# Patient Record
Sex: Female | Born: 2000 | Race: White | Hispanic: No | Marital: Single | Smoking: Never smoker
Health system: Southern US, Community
[De-identification: ages and names within clinical notes are randomized; demographics above are authoritative.]

## PROBLEM LIST (undated history)

## (undated) DIAGNOSIS — J309 Allergic rhinitis, unspecified: Secondary | ICD-10-CM

## (undated) DIAGNOSIS — L309 Dermatitis, unspecified: Secondary | ICD-10-CM

## (undated) DIAGNOSIS — N181 Chronic kidney disease, stage 1: Secondary | ICD-10-CM

## (undated) DIAGNOSIS — J4520 Mild intermittent asthma, uncomplicated: Secondary | ICD-10-CM

## (undated) DIAGNOSIS — Z789 Other specified health status: Secondary | ICD-10-CM

## (undated) HISTORY — PX: NO PAST SURGERIES: SHX2092

---
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
## 2022-05-18 ENCOUNTER — Other Ambulatory Visit: Payer: Self-pay

## 2022-05-18 ENCOUNTER — Encounter: Payer: Self-pay | Admitting: Physician Assistant

## 2022-05-18 DIAGNOSIS — Z3009 Encounter for other general counseling and advice on contraception: Secondary | ICD-10-CM

## 2022-05-18 MED ORDER — VIENVA 0.1-20 MG-MCG PO TABS
1.0000 | ORAL_TABLET | Freq: Every day | ORAL | 3 refills | Status: DC
  Filled 2022-05-18: qty 84, 84d supply, fill #0

## 2022-05-18 NOTE — Telephone Encounter (Signed)
LOV 09/28/21 NOV none LRF 09/28/21 84 x 3

## 2022-05-21 ENCOUNTER — Other Ambulatory Visit: Payer: Self-pay

## 2022-05-22 ENCOUNTER — Other Ambulatory Visit: Payer: Self-pay

## 2022-05-23 ENCOUNTER — Other Ambulatory Visit: Payer: Self-pay

## 2022-10-03 ENCOUNTER — Encounter: Payer: Self-pay | Admitting: Family Medicine

## 2022-10-03 ENCOUNTER — Ambulatory Visit (INDEPENDENT_AMBULATORY_CARE_PROVIDER_SITE_OTHER): Payer: 59 | Admitting: Family Medicine

## 2022-10-03 VITALS — BP 108/75 | HR 85 | Temp 98.4°F | Ht 68.0 in | Wt 154.0 lb

## 2022-10-03 DIAGNOSIS — Z0000 Encounter for general adult medical examination without abnormal findings: Secondary | ICD-10-CM | POA: Diagnosis not present

## 2022-10-03 DIAGNOSIS — J4520 Mild intermittent asthma, uncomplicated: Secondary | ICD-10-CM | POA: Diagnosis not present

## 2022-10-03 DIAGNOSIS — L309 Dermatitis, unspecified: Secondary | ICD-10-CM | POA: Diagnosis not present

## 2022-10-03 DIAGNOSIS — Z3009 Encounter for other general counseling and advice on contraception: Secondary | ICD-10-CM

## 2022-10-03 MED ORDER — DESONIDE 0.05 % EX CREA: TOPICAL_CREAM | Freq: Two times a day (BID) | CUTANEOUS | 1 refills | Status: DC

## 2022-10-03 NOTE — Progress Notes (Signed)
Complete physical exam   Patient: Janet Ross   DOB: 12-19-2000   22 y.o. Female  MRN: [id number] Visit Date: 10/03/2022  Today's healthcare provider: Sherlyn Hay, DO   Chief Complaint  Patient presents with   Annual Exam   Subjective    Janet Ross is a 22 y.o. female who presents today for a complete physical exam.  She reports consuming a general diet. Home exercise routine includes walk/run occasionally oc local trails. She generally feels well. She reports sleeping well. She does not have additional problems to discuss today.  HPI  Flu vaccine: Declined HIV and Hep C screening: Okay to draw per patient  Not currently sexually active for past 1-1.5 years.   Asthma: No flare-up since 2021-2022  - has inhaler and nebulizer at home  Eczema: She states that her eczema is well-controlled at this point. She takes her Xyzal as needed   Past Medical History:  Diagnosis Date   History of MRSA infection 05/07/2012   No past surgical history on file. Social History   Socioeconomic History   Marital status: Single    Spouse name: single   Number of children: 0   Years of education: 6   Highest education level: Not on file  Occupational History   Occupation: [employer]    Comment: going to 7th grade  Tobacco Use   Smoking status: Never   Smokeless tobacco: Never  Vaping Use   Vaping status: Never Used  Substance and Sexual Activity   Alcohol use: Not Currently   Drug use: No   Sexual activity: Yes    Birth control/protection: Condom, Pill, None  Other Topics Concern   Not on file  Social History Narrative   Not on file   Social Determinants of Health   Financial Resource Strain: Not on file  Food Insecurity: Not on file  Transportation Needs: Not on file  Physical Activity: Not on file  Stress: Not on file  Social Connections: Not on file  Intimate Partner Violence: Not on file   Family Status  Relation Name Status   Mother   Alive   Father [place] Alive   Brother [place] Alive   Brother  Alive   MGF Konrad Dolores Bish (Not Specified)   Neg Hx  (Not Specified)  No partnership data on file   Family History  Problem Relation Age of Onset   Healthy Mother    Hypertension Father    Diabetes Father    Hyperlipidemia Father    ADD / ADHD Brother    Hypertension Maternal Grandfather    Colon cancer Neg Hx    Breast cancer Neg Hx    Ovarian cancer Neg Hx    Allergies  Allergen Reactions   Sulfa Antibiotics Other (See Comments)    Other reaction(s): Headache    Patient Care Team: ,  N, DO as PCP - General (Family Medicine)   Medications: Outpatient Medications Prior to Visit  Medication Sig   albuterol (VENTOLIN HFA) 108 (90 Base) MCG/ACT inhaler Inhale 2 puffs into the lungs every 6 (six) hours as needed for wheezing or shortness of breath.   diphenhydrAMINE (BENADRYL) 25 MG tablet Take 25 mg by mouth every 6 (six) hours as needed.   hydrocortisone cream 0.5 % Apply 1 application topically 2 (two) times daily.   levalbuterol (XOPENEX) 0.63 MG/3ML nebulizer solution Take 3 mLs (0.63 mg total) by nebulization every 4 (four) hours as needed for  wheezing or shortness of breath.   levocetirizine (XYZAL) 5 MG tablet Take 1 tablet (5 mg total) by mouth every evening.   VIENVA 0.1-20 MG-MCG tablet Take 1 tablet by mouth daily.   No facility-administered medications prior to visit.    Review of Systems  Constitutional:  Negative for chills, fatigue and fever.  HENT:  Negative for congestion, ear pain, rhinorrhea, sneezing and sore throat.   Eyes: Negative.  Negative for pain and redness.  Respiratory:  Negative for cough, shortness of breath and wheezing.   Cardiovascular:  Negative for chest pain and leg swelling.  Gastrointestinal:  Negative for abdominal pain, blood in stool, constipation, diarrhea and nausea.  Endocrine: Negative for polydipsia and polyphagia.  Genitourinary: Negative.   Negative for dysuria, flank pain, hematuria, pelvic pain, vaginal bleeding and vaginal discharge.  Musculoskeletal:  Negative for arthralgias, back pain, gait problem and joint swelling.  Skin:  Negative for rash.  Neurological: Negative.  Negative for dizziness, tremors, seizures, weakness, light-headedness, numbness and headaches.  Hematological:  Negative for adenopathy.  Psychiatric/Behavioral: Negative.  Negative for behavioral problems, confusion and dysphoric mood. The patient is not nervous/anxious and is not hyperactive.       Objective    BP 108/75 (BP Location: Right Arm, Patient Position: Sitting, Cuff Size: Normal)   Pulse 85   Temp 98.4 F (36.9 C) (Oral)   Ht 5\' 8"  (1.727 m)   Wt 154 lb (69.9 kg)   SpO2 100%   BMI 23.42 kg/m    Physical Exam Vitals reviewed.  Constitutional:      General: She is not in acute distress.    Appearance: Normal appearance. She is well-developed. She is not diaphoretic.  HENT:     Head: Normocephalic and atraumatic.      Comments: Dry scaly patch in region marked and red. Excoriation with scabbing noted to region marked in blue (underneath edge of hairline)    Right Ear: External ear normal. There is no impacted cerumen (cerumen oscuring view of TM; not impacted).     Left Ear: External ear normal. There is no impacted cerumen (cerumen oscuring view of TM; not impacted).     Nose: Nose normal.     Mouth/Throat:     Mouth: Mucous membranes are moist.     Pharynx: Oropharynx is clear. No oropharyngeal exudate.  Eyes:     General: No scleral icterus.    Conjunctiva/sclera: Conjunctivae normal.     Pupils: Pupils are equal, round, and reactive to light.  Neck:     Thyroid: No thyromegaly.  Cardiovascular:     Rate and Rhythm: Normal rate and regular rhythm.     Pulses: Normal pulses.     Heart sounds: Normal heart sounds. No murmur heard. Pulmonary:     Effort: Pulmonary effort is normal. No respiratory distress.     Breath  sounds: Normal breath sounds. No wheezing or rales.  Abdominal:     General: There is no distension.     Palpations: Abdomen is soft. There is no mass.     Tenderness: There is no abdominal tenderness. There is no guarding.  Musculoskeletal:        General: No deformity.     Cervical back: Neck supple.     Right lower leg: No edema.     Left lower leg: No edema.  Lymphadenopathy:     Cervical: No cervical adenopathy.  Skin:    General: Skin is warm and dry.  Findings: Rash present.  Neurological:     Mental Status: She is alert and oriented to person, place, and time. Mental status is at baseline.     Gait: Gait normal.  Psychiatric:        Mood and Affect: Mood normal.        Behavior: Behavior normal.        Thought Content: Thought content normal.      Last depression screening scores    10/03/2022    2:51 PM 09/28/2021    2:21 PM 02/11/2020   10:12 AM  PHQ 2/9 Scores  PHQ - 2 Score 0 0 0  PHQ- 9 Score 0 0 0   Last fall risk screening    10/03/2022    2:51 PM  Fall Risk   Falls in the past year? 0  Number falls in past yr: 0  Injury with Fall? 1   Last Audit-C alcohol use screening    10/03/2022    2:51 PM  Alcohol Use Disorder Test (AUDIT)  1. How often do you have a drink containing alcohol? 0  2. How many drinks containing alcohol do you have on a typical day when you are drinking? 0  3. How often do you have six or more drinks on one occasion? 0  AUDIT-C Score 0   A score of 3 or more in women, and 4 or more in men indicates increased risk for alcohol abuse, EXCEPT if all of the points are from question 1   No results found for any visits on 10/03/22.  Assessment & Plan    Routine Health Maintenance and Physical Exam  Exercise Activities and Dietary recommendations  Goals   None     Immunization History  Administered Date(s) Administered   DTaP 09/08/2002, 11/20/2002, 01/08/2003, 10/15/2003, 04/18/2007   HIB (PRP-OMP) 09/08/2002, 11/30/2002,  01/08/2003, 07/09/2003   HPV 9-valent 02/16/2015, 04/25/2015, 08/24/2016   Hepatitis A, Ped/Adol-2 Dose 08/16/2014, 02/16/2015   Hepatitis B 2002/12/02, 11/20/2002, 10/15/2003   IPV 09/08/2002, 01/08/2003, 10/15/2003, 04/18/2007   Influenza,inj,Quad PF,6+ Mos 12/16/2014, 12/04/2016, 11/05/2018   Influenza-Unspecified 11/26/2013   MMR 07/09/2003, 04/18/2007   Meningococcal B, OMV 09/03/2018, 11/14/2018   Meningococcal Conjugate 08/05/2013   Meningococcal Mcv4o 09/03/2018   PFIZER(Purple Top)SARS-COV-2 Vaccination 09/19/2019   PPD Test 06/24/2017   Pneumococcal-Unspecified 09/08/2002, 11/20/2002, 01/08/2003, 07/09/2003   Td 08/05/2013   Tdap 08/05/2013   Varicella 07/09/2003, 04/18/2007    Health Maintenance  Topic Date Due   HIV Screening  Never done   Hepatitis C Screening  Never done   COVID-19 Vaccine (2 - 2023-24 season) 10/19/2022 (Originally 10/20/2021)   INFLUENZA VACCINE  05/20/2023 (Originally 09/20/2022)   CHLAMYDIA SCREENING  10/03/2023 (Originally 07/01/2017)   DTaP/Tdap/Td (8 - Td or Tdap) 08/06/2023   HPV VACCINES  Completed    Discussed health benefits of physical activity, and encouraged her to engage in regular exercise appropriate for her age and condition.   Annual physical exam Assessment & Plan: Physical exam overall unremarkable.  Will order routine lab work today.  Will also order hepatitis C and HIV screening with patient's consent.  Orders: -     Comprehensive metabolic panel -     TSH Rfx on Abnormal to Free T4 -     HIV Antibody (routine testing w rflx) -     HCV Ab w Reflex to Quant PCR  Mild intermittent asthma without complication Assessment & Plan: Extremely well-controlled.  Patient endorses not having to use her inhaler or  nebulizer virtually at all in the last several years.  This appears to correspond with her use of Xyzal.  I suspect her asthma is an allergic-variant.   Eczema, unspecified type Assessment & Plan: She originally began  having problems with eczema 2021-2022.  She has been taking cetirizine with significant relief.  However, she does still have some dry patches, particularly along/in her hairline, as well as behind her right ear.  Will prescribe desonide cream today for patient to apply sparingly to address this.  Orders: -     Desonide; Apply topically 2 (two) times daily.  Dispense: 30 g; Refill: 1  Encounter for counseling regarding contraception Assessment & Plan: Patient is not currently sexually active but continues to take Vienva and is not having any adverse effects.     No follow-ups on file.     I discussed the assessment and treatment plan with the patient  The patient was provided an opportunity to ask questions and all were answered. The patient agreed with the plan and demonstrated an understanding of the instructions.   The patient was advised to call back or seek an in-person evaluation if the symptoms worsen or if the condition fails to improve as anticipated.    Sherlyn Hay, DO  [facility] [phone] (phone) [phone] (fax)  [facility]

## 2022-10-03 NOTE — Assessment & Plan Note (Signed)
Extremely well-controlled.  Patient endorses not having to use her inhaler or nebulizer virtually at all in the last several years.  This appears to correspond with her use of Xyzal.  I suspect her asthma is an allergic-variant.

## 2022-10-03 NOTE — Assessment & Plan Note (Addendum)
She originally began having problems with eczema 2021-2022.  She has been taking cetirizine with significant relief.  However, she does still have some dry patches, particularly along/in her hairline, as well as behind her right ear.  Will prescribe desonide cream today for patient to apply sparingly to address this.

## 2022-10-03 NOTE — Assessment & Plan Note (Signed)
Patient is not currently sexually active but continues to take [facility] and is not having any adverse effects.

## 2022-10-03 NOTE — Assessment & Plan Note (Addendum)
Physical exam overall unremarkable.  Will order routine lab work today.  Will also order hepatitis C and HIV screening with patient's consent.

## 2022-10-04 LAB — COMPREHENSIVE METABOLIC PANEL
ALT: 8 IU/L (ref 0–32)
AST: 12 IU/L (ref 0–40)
Albumin: 4.5 g/dL (ref 4.0–5.0)
Alkaline Phosphatase: 59 IU/L (ref 42–106)
BUN/Creatinine Ratio: 13 (ref 9–23)
BUN: 12 mg/dL (ref 6–20)
Bilirubin Total: 0.3 mg/dL (ref 0.0–1.2)
CO2: 23 mmol/L (ref 20–29)
Calcium: 9.3 mg/dL (ref 8.7–10.2)
Chloride: 104 mmol/L (ref 96–106)
Creatinine, Ser: 0.9 mg/dL (ref 0.57–1.00)
Globulin, Total: 2.5 g/dL (ref 1.5–4.5)
Glucose: 85 mg/dL (ref 70–99)
Potassium: 4.2 mmol/L (ref 3.5–5.2)
Sodium: 141 mmol/L (ref 134–144)
Total Protein: 7 g/dL (ref 6.0–8.5)
eGFR: 94 mL/min/{1.73_m2} (ref >59)

## 2022-10-04 LAB — HCV INTERPRETATION

## 2022-10-04 LAB — HIV ANTIBODY (ROUTINE TESTING W REFLEX): HIV Screen 4th Generation wRfx: NONREACTIVE

## 2022-10-04 LAB — TSH RFX ON ABNORMAL TO FREE T4: TSH: 1.69 u[IU]/mL (ref 0.450–4.500)

## 2022-10-04 LAB — HCV AB W REFLEX TO QUANT PCR: HCV Ab: NONREACTIVE

## 2022-10-09 ENCOUNTER — Other Ambulatory Visit: Payer: Self-pay | Admitting: Family Medicine

## 2022-10-09 DIAGNOSIS — N181 Chronic kidney disease, stage 1: Secondary | ICD-10-CM

## 2022-12-14 ENCOUNTER — Telehealth: Payer: 59 | Admitting: Nurse Practitioner

## 2022-12-14 DIAGNOSIS — J069 Acute upper respiratory infection, unspecified: Secondary | ICD-10-CM

## 2022-12-14 DIAGNOSIS — B9789 Other viral agents as the cause of diseases classified elsewhere: Secondary | ICD-10-CM

## 2022-12-14 MED ORDER — IPRATROPIUM BROMIDE 0.03 % NA SOLN: 2.0000 | Freq: Two times a day (BID) | NASAL | 12 refills | Status: AC

## 2022-12-14 NOTE — Progress Notes (Signed)
Janet Ross,   This same time last year you were seen for allergic sinusitis.  We do not recommend antibiotics prior to 7-10 days of symptoms    I would advise starting the nasal spray and taking a daily oral antihistamine as well      Providers prescribe antibiotics to treat infections caused by bacteria. Antibiotics are very powerful in treating bacterial infections when they are used properly. To maintain their effectiveness, they should be used only when necessary. Overuse of antibiotics has resulted in the development of superbugs that are resistant to treatment!     After careful review of your answers, I would not recommend an antibiotic for your condition.  Antibiotics are not effective against viruses and therefore should not be used to treat them. Common examples of infections caused by viruses include colds and flu

## 2022-12-14 NOTE — Progress Notes (Signed)
E-Visit for Sinus Problems  We are sorry that you are not feeling well.  Here is how we plan to help!  Based on what you have shared with me it looks like you have sinusitis.  Sinusitis is inflammation and infection in the sinus cavities of the head.  Based on your presentation I believe you most likely have Acute Viral Sinusitis.This is an infection most likely caused by a virus. There is not specific treatment for viral sinusitis other than to help you with the symptoms until the infection runs its course.  You may use an oral decongestant such as Mucinex D or if you have glaucoma or high blood pressure use plain Mucinex. Saline nasal spray help and can safely be used as often as needed for congestion, I have prescribed: Ipratropium Bromide nasal spray 0.03% 2 sprays in eah nostril 2-3 times a day  Some authorities believe that zinc sprays or the use of Echinacea may shorten the course of your symptoms.  Sinus infections are not as easily transmitted as other respiratory infection, however we still recommend that you avoid close contact with loved ones, especially the very young and elderly.  Remember to wash your hands thoroughly throughout the day as this is the number one way to prevent the spread of infection!  Home Care: Only take medications as instructed by your medical team. Do not take these medications with alcohol. A steam or ultrasonic humidifier can help congestion.  You can place a towel over your head and breathe in the steam from hot water coming from a faucet. Avoid close contacts especially the very young and the elderly. Cover your mouth when you cough or sneeze. Always remember to wash your hands.  Get Help Right Away If: You develop worsening fever or sinus pain. You develop a severe head ache or visual changes. Your symptoms persist after you have completed your treatment plan.  Make sure you Understand these instructions. Will watch your condition. Will get help  right away if you are not doing well or get worse.   Thank you for choosing an e-visit.  Your e-visit answers were reviewed by a board certified advanced clinical practitioner to complete your personal care plan. Depending upon the condition, your plan could have included both over the counter or prescription medications.  Please review your pharmacy choice. Make sure the pharmacy is open so you can pick up prescription now. If there is a problem, you may contact your provider through [employer] and have the prescription routed to another pharmacy.  Your safety is important to [place]. If you have drug allergies check your prescription carefully.   For the next 24 hours you can use MyChart to ask questions about today's visit, request a non-urgent call back, or ask for a work or school excuse. You will get an email in the next two days asking about your experience. I hope that your e-visit has been valuable and will speed your recovery.  Meds ordered this encounter  Medications   ipratropium (ATROVENT) 0.03 % nasal spray    Sig: Place 2 sprays into both nostrils every 12 (twelve) hours.    Dispense:  30 mL    Refill:  12    I spent approximately 5 minutes reviewing the patient's history, current symptoms and coordinating their care today.

## 2023-01-08 ENCOUNTER — Telehealth: Payer: 59 | Admitting: Family Medicine

## 2023-01-08 DIAGNOSIS — H00015 Hordeolum externum left lower eyelid: Secondary | ICD-10-CM

## 2023-01-08 MED ORDER — ERYTHROMYCIN 5 MG/GM OP OINT: 1.0000 | TOPICAL_OINTMENT | Freq: Every day | OPHTHALMIC | 0 refills | Status: DC

## 2023-01-08 NOTE — Progress Notes (Signed)
  E-Visit for Stye   We are sorry that you are not feeling well. Here is how we plan to help!  Based on what you have shared with me it looks like you have a stye.  A stye is an inflammation of the eyelid.  It is often a red, painful lump near the edge of the eyelid that may look like a boil or a pimple.  A stye develops when an infection occurs at the base of an eyelash.   We have made appropriate suggestions for you based upon your presentation: Simple styes can be treated without medical intervention.  Most styes either resolve spontaneously or resolve with simple home treatment by applying warm compresses or heated washcloth to the stye for about 10-15 minutes three to four times a day. This causes the stye to drain and resolve.  I will order an ointment to apply to the area to help prevent infection since it is taking a bit for it to open and heal.   HOME CARE:  Wash your hands often! Let the stye open on its own. Don't squeeze or open it. Don't rub your eyes. This can irritate your eyes and let in bacteria.  If you need to touch your eyes, wash your hands first. Don't wear eye makeup or contact lenses until the area has healed.  GET HELP RIGHT AWAY IF:  Your symptoms do not improve. You develop blurred or loss of vision. Your symptoms worsen (increased discharge, pain or redness).   Thank you for choosing an e-visit.  Your e-visit answers were reviewed by a board certified advanced clinical practitioner to complete your personal care plan. Depending upon the condition, your plan could have included both over the counter or prescription medications.  Please review your pharmacy choice. Make sure the pharmacy is open so you can pick up prescription now. If there is a problem, you may contact your provider through [employer] and have the prescription routed to another pharmacy.  Your safety is important to [place]. If you have drug allergies check your prescription carefully.    For the next 24 hours you can use MyChart to ask questions about today's visit, request a non-urgent call back, or ask for a work or school excuse. You will get an email in the next two days asking about your experience. I hope that your e-visit has been valuable and will speed your recovery.  I provided 5 minutes of non face-to-face time during this encounter for chart review, medication and order placement, as well as and documentation.

## 2023-02-13 ENCOUNTER — Other Ambulatory Visit: Payer: Self-pay | Admitting: Physician Assistant

## 2023-02-13 DIAGNOSIS — Z3009 Encounter for other general counseling and advice on contraception: Secondary | ICD-10-CM

## 2023-02-27 ENCOUNTER — Telehealth: Payer: 59 | Admitting: Physician Assistant

## 2023-02-27 DIAGNOSIS — R109 Unspecified abdominal pain: Secondary | ICD-10-CM

## 2023-02-27 DIAGNOSIS — N941 Unspecified dyspareunia: Secondary | ICD-10-CM

## 2023-02-27 DIAGNOSIS — N898 Other specified noninflammatory disorders of vagina: Secondary | ICD-10-CM

## 2023-02-27 DIAGNOSIS — R300 Dysuria: Secondary | ICD-10-CM

## 2023-02-27 NOTE — Progress Notes (Signed)
 Because of belly pain and pain on intercourse along with vaginal symptoms and urinary symptoms and need for exam and testing, I feel your condition warrants further evaluation and I recommend that you be seen in a face to face visit.   NOTE: There will be NO CHARGE for this eVisit   If you are having a true medical emergency please call 911.      For an urgent face to face visit, [place] has eight urgent care centers for your convenience:   NEW!! [facility] Health Urgent Care Center at [facility] Get Driving Directions [phone] [address] [place], [address]    [facility] Health Urgent Care Center at [facility] Get Driving Directions [phone] [address] [place], [place] [address]   [facility] Health Urgent Care Center [facility]) Get Driving Directions [phone] [address] [place], [place] [address]  [facility] Health Urgent Care Center [facility] [place]) Get Driving Directions [phone] [address] [place],  [place]  [address]  [facility] Health Urgent Care Center [facility] - at [employer]  [phone] [address] [place],  [place] [address]   [facility] Health Urgent Care at [facility] Get Driving Directions [phone] [address] [place], [place] [address]   [facility] Health Urgent Care at [facility] Get Driving Directions  [phone] [address] [place], [place] [address]   [facility] Health Urgent Care at [facility] Directions [phone] [address], [address] [place], [place] [address]  Your MyChart E-visit questionnaire answers were reviewed by a board certified advanced clinical practitioner to complete your personal care plan based on your specific symptoms.  Thank you for using e-Visits.

## 2023-05-19 LAB — C. TRACHOMATIS / N. GONORRHOEAE, DNA PROBE: Chlamydia, Swab/Urine, PCR: NEGATIVE

## 2023-07-31 ENCOUNTER — Telehealth: Admitting: Physician Assistant

## 2023-07-31 DIAGNOSIS — J0300 Acute streptococcal tonsillitis, unspecified: Secondary | ICD-10-CM

## 2023-07-31 NOTE — Progress Notes (Signed)
  Because of recurrence with Strep C culture positive having failed PCNs and cephalosporins, I feel your condition warrants further evaluation and I recommend that you be seen in a face-to-face visit. Next line treatment is IM Gentamicin which needs to be given in and in-person setting.   NOTE: There will be NO CHARGE for this E-Visit   If you are having a true medical emergency, please call 911.     For an urgent face to face visit, [place] has multiple urgent care centers for your convenience.  Click the link below for the full list of locations and hours, walk-in wait times, appointment scheduling options and driving directions:  Urgent Care - [place], [place], [place], [place], [place], [place]  [place]     Your MyChart E-visit questionnaire answers were reviewed by a board certified advanced clinical practitioner to complete your personal care plan based on your specific symptoms.    Thank you for using e-Visits.      I have spent 5 minutes in review of e-visit questionnaire, review and updating patient chart, medical decision making and response to patient.   Angelia Kelp, PA-C

## 2023-08-28 ENCOUNTER — Other Ambulatory Visit: Payer: Self-pay | Admitting: Unknown Physician Specialty

## 2023-08-28 DIAGNOSIS — J3501 Chronic tonsillitis: Secondary | ICD-10-CM | POA: Diagnosis not present

## 2023-08-28 DIAGNOSIS — H6123 Impacted cerumen, bilateral: Secondary | ICD-10-CM | POA: Diagnosis not present

## 2023-09-04 ENCOUNTER — Encounter: Payer: Self-pay | Admitting: Unknown Physician Specialty

## 2023-09-06 NOTE — Discharge Instructions (Signed)
T & A INSTRUCTION SHEET - [facility]  CHAPMAN MCQUEEN, MD    INFORMATION SHEET FOR A TONSILLECTOMY AND ADENDOIDECTOMY  About Your Tonsils and Adenoids  The tonsils and adenoids are normal body tissues that are part of our immune system.  They normally help to protect us against diseases that may enter our mouth and nose. However, sometimes the tonsils and/or adenoids become too large and obstruct our breathing, especially at night.    If either of these things happen it helps to remove the tonsils and adenoids in order to become healthier. The operation to remove the tonsils and adenoids is called a tonsillectomy and adenoidectomy.  The Location of Your Tonsils and Adenoids  The tonsils are located in the back of the throat on both side and sit in a cradle of muscles. The adenoids are located in the roof of the mouth, behind the nose, and closely associated with the opening of the Eustachian tube to the ear.  Surgery on Tonsils and Adenoids  A tonsillectomy and adenoidectomy is a short operation which takes about thirty minutes.  This includes being put to sleep and being awakened. Tonsillectomies and adenoidectomies are performed at [facility] and may require observation period in the recovery room prior to going home. Children are required to remain in recovery for at least 45 minutes.   Following the Operation for a Tonsillectomy  A cautery machine is used to control bleeding. Bleeding from a tonsillectomy and adenoidectomy is minimal and postoperatively the risk of bleeding is approximately four percent, although this rarely life threatening.  After your tonsillectomy and adenoidectomy post-op care at home: 1. Our patients are able to go home the same day. You may be given prescriptions for pain medications, if indicated. 2. It is extremely important to remember that fluid intake is of utmost importance after a tonsillectomy. The  amount that you drink must be maintained in the postoperative period. A good indication of whether a child is getting enough fluid is whether his/her urine output is constant. As long as children are urinating or wetting their diaper every 6 - 8 hours this is usually enough fluid intake.   3. Although rare, this is a risk of some bleeding in the first ten days after surgery. This usually occurs between day five and nine postoperatively. This risk of bleeding is approximately four percent. If you or your child should have any bleeding you should remain calm and notify our office or go directly to the emergency room at [facility] where they will contact us. Our doctors are available seven days a week for notification. We recommend sitting up quietly in a chair, place an ice pack on the front of the neck and spitting out the blood gently until we are able to contact you. Adults should gargle gently with ice water and this may help stop the bleeding. If the bleeding does not stop after a short time, i.e. 10 to 15 minutes, or seems to be increasing again, please contact us or go to the hospital.   4. It is common for the pain to be worse at 5 - 7 days postoperatively. This occurs because the "scab" is peeling off and the mucous membrane (skin of the throat) is growing back where the tonsils were.   5. It is common for a low-grade fever, less than 102, during the first week after a tonsillectomy and adenoidectomy. It is usually due to   not drinking enough liquids, and we suggest your use liquid Tylenol (acetaminophen) or the pain medicine with Tylenol (acetaminophen) prescribed in order to keep your temperature below 102. Please follow the directions on the back of the bottle. 6. Recommendations for post-operative pain in children and adults: a) For Children 12 and younger: Recommendations are for oral Tylenol (acetaminophen) and oral Motrin (Ibuprofen). Administer the Tylenol (acetaminophen) and  Motrin (ibuprofen) as stated on bottle for patient's age/weight. Sometimes it may be necessary to alternate the Tylenol (acetaminophen) and Motrin for improved pain control. Motrin does last slightly longer so many patients benefit from being given this prior to bedtime. All children should avoid Aspirin products for 2 weeks following surgery. b) For children over the age of 12: Tylenol (acetaminophen) is the preferred first choice for pain control. Depending on your child's size, sometimes they will be given a combination of Tylenol (acetaminophen) and hydrocodone medication or sometimes it will be recommended they take Motrin (ibuprofen) in addition to the Tylenol (acetaminophen). Narcotics should always be used with caution in children following surgery as they can suppress their breathing and switching to over the counter Tylenol (acetaminophen) and Motrin (ibuprofen) as soon as possible is recommended. All patients should avoid Aspirin products for 2 weeks following surgery. c) Adults: Usually adults will require a narcotic pain medication following a tonsillectomy. This usually has either hydrocodone or oxycodone in it and can usually be taken every 4 to 6 hours as needed for moderate pain. If the medication does not have Tylenol (acetaminophen) in it, you may also supplement Tylenol (acetaminophen) as needed every 4 to 6 hours for breakthrough or mild pain. Adults should avoid Aspirin, Aleve, Motrin, and Ibuprofen products for 2 weeks following surgery as they can increase your risk of bleeding. 7. If you happen to look in the mirror or into your child's mouth you will see white/gray patches on the back of the throat. This is what a scab looks like in the mouth and is normal after having a tonsillectomy and adenoidectomy. They will disappear once the tonsil areas heal completely. However, it may cause a noticeable odor, and this too will disappear with time.     8. You or your child may experience ear  pain after having a tonsillectomy and adenoidectomy.  This is called referred pain and comes from the throat, but it is felt in the ears.  Ear pain is quite common and expected. It will usually go away after ten days. There is usually nothing wrong with the ears, and it is primarily due to the healing area stimulating the nerve to the ear that runs along the side of the throat. Use either the prescribed pain medicine or Tylenol (acetaminophen) as needed.  9. The throat tissues after a tonsillectomy are obviously sensitive. Smoking around children who have had a tonsillectomy significantly increases the risk of bleeding. DO NOT SMOKE!  

## 2023-09-13 ENCOUNTER — Ambulatory Visit: Payer: Self-pay | Admitting: Anesthesiology

## 2023-09-13 ENCOUNTER — Other Ambulatory Visit: Payer: Self-pay

## 2023-09-13 ENCOUNTER — Encounter: Payer: Self-pay | Admitting: Unknown Physician Specialty

## 2023-09-13 ENCOUNTER — Encounter
Admission: RE | Disposition: A | Discharge status: Home / Self Care | Payer: Self-pay | Source: Home / Self Care | Attending: Unknown Physician Specialty

## 2023-09-13 ENCOUNTER — Ambulatory Visit
Admission: RE | Admit: 2023-09-13 | Discharge: 2023-09-13 | Disposition: A | Discharge status: Home / Self Care | Attending: Unknown Physician Specialty | Admitting: Unknown Physician Specialty

## 2023-09-13 DIAGNOSIS — N181 Chronic kidney disease, stage 1: Secondary | ICD-10-CM | POA: Insufficient documentation

## 2023-09-13 DIAGNOSIS — J3501 Chronic tonsillitis: Secondary | ICD-10-CM | POA: Insufficient documentation

## 2023-09-13 DIAGNOSIS — J4520 Mild intermittent asthma, uncomplicated: Secondary | ICD-10-CM | POA: Insufficient documentation

## 2023-09-13 DIAGNOSIS — J353 Hypertrophy of tonsils with hypertrophy of adenoids: Secondary | ICD-10-CM | POA: Diagnosis not present

## 2023-09-13 DIAGNOSIS — J351 Hypertrophy of tonsils: Secondary | ICD-10-CM | POA: Diagnosis not present

## 2023-09-13 HISTORY — DX: Chronic kidney disease, stage 1: N18.1

## 2023-09-13 HISTORY — PX: TONSILLECTOMY AND ADENOIDECTOMY: SHX28

## 2023-09-13 HISTORY — DX: Dermatitis, unspecified: L30.9

## 2023-09-13 HISTORY — DX: Other specified health status: Z78.9

## 2023-09-13 HISTORY — DX: Allergic rhinitis, unspecified: J30.9

## 2023-09-13 HISTORY — DX: Mild intermittent asthma, uncomplicated: J45.20

## 2023-09-13 LAB — POCT PREGNANCY, URINE: Preg Test, Ur: NEGATIVE

## 2023-09-13 SURGERY — TONSILLECTOMY AND ADENOIDECTOMY
Anesthesia: General | Site: Mouth | Laterality: Bilateral

## 2023-09-13 MED ORDER — LIDOCAINE HCL (CARDIAC) PF 100 MG/5ML IV SOSY
PREFILLED_SYRINGE | INTRAVENOUS | Status: DC | PRN
  Administered 2023-09-13: 50 mg via INTRAVENOUS

## 2023-09-13 MED ORDER — ACETAMINOPHEN 10 MG/ML IV SOLN
INTRAVENOUS | Status: AC
  Filled 2023-09-13: qty 100

## 2023-09-13 MED ORDER — ACETAMINOPHEN 10 MG/ML IV SOLN
1000.0000 mg | Freq: Once | INTRAVENOUS | Status: AC
  Administered 2023-09-13: 1000 mg via INTRAVENOUS

## 2023-09-13 MED ORDER — LACTATED RINGERS IV SOLN: INTRAVENOUS | Status: DC

## 2023-09-13 MED ORDER — BUPIVACAINE HCL (PF) 0.5 % IJ SOLN
INTRAMUSCULAR | Status: DC | PRN
  Administered 2023-09-13: 8.5 mL

## 2023-09-13 MED ORDER — OXYCODONE HCL 5 MG/5ML PO SOLN
ORAL | Status: AC
  Filled 2023-09-13: qty 10

## 2023-09-13 MED ORDER — ONDANSETRON HCL 4 MG/2ML IJ SOLN
INTRAMUSCULAR | Status: DC | PRN
  Administered 2023-09-13: 4 mg via INTRAVENOUS

## 2023-09-13 MED ORDER — SUCCINYLCHOLINE CHLORIDE 200 MG/10ML IV SOSY
PREFILLED_SYRINGE | INTRAVENOUS | Status: DC | PRN
  Administered 2023-09-13: 120 mg via INTRAVENOUS

## 2023-09-13 MED ORDER — LIDOCAINE VISCOUS HCL 2 % MT SOLN: 10.0000 mL | OROMUCOSAL | 5 refills | Status: AC | PRN

## 2023-09-13 MED ORDER — FENTANYL CITRATE (PF) 100 MCG/2ML IJ SOLN
INTRAMUSCULAR | Status: AC
  Filled 2023-09-13: qty 2

## 2023-09-13 MED ORDER — FENTANYL CITRATE (PF) 100 MCG/2ML IJ SOLN
INTRAMUSCULAR | Status: DC | PRN
  Administered 2023-09-13 (×2): 50 ug via INTRAVENOUS

## 2023-09-13 MED ORDER — PROPOFOL 10 MG/ML IV BOLUS
INTRAVENOUS | Status: AC
  Filled 2023-09-13: qty 40

## 2023-09-13 MED ORDER — HYDROCODONE-ACETAMINOPHEN 7.5-325 MG/15ML PO SOLN: 10.0000 mL | Freq: Four times a day (QID) | ORAL | 0 refills | Status: AC | PRN

## 2023-09-13 MED ORDER — SODIUM CHLORIDE 0.9 % IV SOLN: INTRAVENOUS | Status: DC

## 2023-09-13 MED ORDER — MIDAZOLAM HCL 2 MG/2ML IJ SOLN
INTRAMUSCULAR | Status: AC
  Filled 2023-09-13: qty 2

## 2023-09-13 MED ORDER — OXYCODONE HCL 5 MG/5ML PO SOLN
10.0000 mg | Freq: Once | ORAL | Status: AC
  Administered 2023-09-13: 10 mg via ORAL

## 2023-09-13 MED ORDER — PROPOFOL 10 MG/ML IV BOLUS
INTRAVENOUS | Status: DC | PRN
Start: 2023-09-13 — End: 2023-09-13
  Administered 2023-09-13: 200 mg via INTRAVENOUS
  Administered 2023-09-13: 25 mg via INTRAVENOUS
  Administered 2023-09-13: 50 mg via INTRAVENOUS

## 2023-09-13 MED ORDER — MIDAZOLAM HCL 5 MG/5ML IJ SOLN
INTRAMUSCULAR | Status: DC | PRN
  Administered 2023-09-13: 2 mg via INTRAVENOUS

## 2023-09-13 MED ORDER — DEXAMETHASONE SODIUM PHOSPHATE 4 MG/ML IJ SOLN
INTRAMUSCULAR | Status: DC | PRN
  Administered 2023-09-13: 10 mg via INTRAVENOUS

## 2023-09-13 SURGICAL SUPPLY — 15 items
CANISTER SUCT 1200ML W/VALVE (MISCELLANEOUS) ×1 IMPLANT
CATH ROBINSON RED A/P 8FR (CATHETERS) ×1 IMPLANT
COAGULATOR SUCTION 6 10FR HC (MISCELLANEOUS) ×1 IMPLANT
DRAPE HEAD BAR (DRAPES) ×1 IMPLANT
ELECTRODE CAUTERY BLDE TIP 2.5 (TIP) ×1 IMPLANT
ELECTRODE REM PT RTRN 9FT ADLT (ELECTROSURGICAL) ×1 IMPLANT
GLOVE BIO SURGEON STRL SZ7.5 (GLOVE) ×1 IMPLANT
KIT TURNOVER KIT A (KITS) ×1 IMPLANT
NS IRRIG 500ML POUR BTL (IV SOLUTION) ×1 IMPLANT
PACK TONSIL AND ADENOID CUSTOM (PACKS) ×1 IMPLANT
PENCIL ELECTRO HAND CTR (MISCELLANEOUS) ×1 IMPLANT
SOLUTION ANTFG W/FOAM PAD STRL (MISCELLANEOUS) ×1 IMPLANT
SPONGE TONSIL 1 RF SGL (DISPOSABLE) ×1 IMPLANT
STRAP BODY AND KNEE 60X3 (MISCELLANEOUS) ×1 IMPLANT
SYR 10ML LL (SYRINGE) ×1 IMPLANT

## 2023-09-13 NOTE — H&P (Signed)
 The patient's history has been reviewed, patient examined, no change in status, stable for surgery.  Questions were answered to the patients satisfaction.

## 2023-09-13 NOTE — Op Note (Signed)
 PREOPERATIVE DIAGNOSIS:  CHRONIC TONSILLLITIS  POSTOPERATIVE DIAGNOSIS: Same  OPERATION:  Tonsillectomy and adenoidectomy.  SURGEON:  Janet IVAR Hasten, MD  ANESTHESIA:  General endotracheal.  OPERATIVE FINDINGS:  Large tonsils and adenoids.  DESCRIPTION OF THE PROCEDURE:  Janet Ross was identified in the holding area and taken to the operating room and placed in the supine position.  After general endotracheal anesthesia, the table was turned 45 degrees and the patient was draped in the usual fashion for a tonsillectomy.  A mouth gag was inserted into the oral cavity and examination of the oropharynx showed the uvula was non-bifid.  There was no evidence of submucous cleft to the palate.  There were large tonsils.  A red rubber catheter was placed through the nostril.  Examination of the nasopharynx showed large obstructing adenoids.  Under indirect vision with the mirror, an adenotome was placed in the nasopharynx.  The adenoids were curetted free.  Reinspection with a mirror showed excellent removal of the adenoid.  Nasopharyngeal packs were then placed.  The operation then turned to the tonsillectomy.  Beginning on the left-hand side a tenaculum was used to grasp the tonsil and the Bovie cautery was used to dissect it free from the fossa.  In a similar fashion, the right tonsil was removed.  Meticulous hemostasis was achieved using the Bovie cautery.  With both tonsils removed and no active bleeding, the nasopharyngeal packs were removed.  Suction cautery was then used to cauterize the nasopharyngeal bed to prevent bleeding.  The red rubber catheter was removed with no active bleeding.  0.5% plain Marcaine was used to inject the anterior and posterior tonsillar pillars bilaterally.  A total of 8ml was used.  The patient tolerated the procedure well and was awakened in the operating room and taken to the recovery room in stable condition.   CULTURES:  None.  SPECIMENS:  Tonsils and  adenoids.  ESTIMATED BLOOD LOSS:  Less than 20 ml.  Janet Ross  09/13/2023  10:32 AM

## 2023-09-13 NOTE — Transfer of Care (Signed)
 Immediate Anesthesia Transfer of Care Note  Patient: Janet Ross  Procedure(s) Performed: TONSILLECTOMY AND ADENOIDECTOMY (Bilateral: Mouth)  Patient Location: PACU  Anesthesia Type: General ETT  Level of Consciousness: awake, alert  and patient cooperative  Airway and Oxygen Therapy: Patient Spontanous Breathing and Patient connected to supplemental oxygen  Post-op Assessment: Post-op Vital signs reviewed, Patient's Cardiovascular Status Stable, Respiratory Function Stable, Patent Airway and No signs of Nausea or vomiting  Post-op Vital Signs: Reviewed and stable  Complications: No notable events documented.

## 2023-09-13 NOTE — Anesthesia Preprocedure Evaluation (Addendum)
 Anesthesia Evaluation  Patient identified by MRN, date of birth, ID band Patient awake    Reviewed: Allergy & Precautions, H&P , NPO status , Patient's Chart, lab work & pertinent test results  Airway Mallampati: II  TM Distance: >3 FB Neck ROM: Full    Dental no notable dental hx. (+) Teeth Intact   Pulmonary neg pulmonary ROS, asthma    Pulmonary exam normal breath sounds clear to auscultation       Cardiovascular negative cardio ROS Normal cardiovascular exam Rhythm:Regular Rate:Normal     Neuro/Psych negative neurological ROS  negative psych ROS   GI/Hepatic negative GI ROS, Neg liver ROS,,,  Endo/Other  negative endocrine ROS    Renal/GU Renal diseasenegative Renal ROS  negative genitourinary   Musculoskeletal negative musculoskeletal ROS (+)    Abdominal   Peds negative pediatric ROS (+)  Hematology negative hematology ROS (+)   Anesthesia Other Findings HCG negative  History of MRSA infection  Medical history non-contributory Eczema  Chronic kidney disease, stage 1 Mild intermittent asthma in adult without complication  Allergic rhinitis    Reproductive/Obstetrics negative OB ROS                              Anesthesia Physical Anesthesia Plan  ASA: 2  Anesthesia Plan: General ETT   Post-op Pain Management:    Induction: Intravenous  PONV Risk Score and Plan:   Airway Management Planned: Oral ETT  Additional Equipment:   Intra-op Plan:   Post-operative Plan: Extubation in OR  Informed Consent: I have reviewed the patients History and Physical, chart, labs and discussed the procedure including the risks, benefits and alternatives for the proposed anesthesia with the patient or authorized representative who has indicated his/her understanding and acceptance.     Dental Advisory Given  Plan Discussed with: Anesthesiologist, CRNA and Surgeon  Anesthesia  Plan Comments: (Patient consented for risks of anesthesia including but not limited to:  - adverse reactions to medications - damage to eyes, teeth, lips or other oral mucosa - nerve damage due to positioning  - sore throat or hoarseness - Damage to heart, brain, nerves, lungs, other parts of body or loss of life  Patient voiced understanding and assent.)         Anesthesia Quick Evaluation

## 2023-09-13 NOTE — Anesthesia Postprocedure Evaluation (Signed)
 Anesthesia Post Note  Patient: Janet Ross  Procedure(s) Performed: TONSILLECTOMY AND ADENOIDECTOMY (Bilateral: Mouth)  Patient location during evaluation: PACU Anesthesia Type: General Level of consciousness: awake and alert Pain management: pain level controlled Vital Signs Assessment: post-procedure vital signs reviewed and stable Respiratory status: spontaneous breathing, nonlabored ventilation, respiratory function stable and patient connected to nasal cannula oxygen Cardiovascular status: blood pressure returned to baseline and stable Postop Assessment: no apparent nausea or vomiting Anesthetic complications: no   No notable events documented.   Last Vitals:  Vitals:   09/13/23 1105 09/13/23 1110  BP: (!) 116/96 113/73  Pulse: 87   Resp: 11   Temp: (!) 36.4 C (!) 36.4 C  SpO2: 100%     Last Pain:  Vitals:   09/13/23 1110  TempSrc:   PainSc: 5                  Raley Novicki C Joshuwa Vecchio

## 2023-09-13 NOTE — Anesthesia Procedure Notes (Signed)
 Procedure Name: Intubation Date/Time: 09/13/2023 10:04 AM  Performed by: Dave Maus, CRNAPre-anesthesia Checklist: Patient identified, Emergency Drugs available, Suction available and Patient being monitored Patient Re-evaluated:Patient Re-evaluated prior to induction Oxygen Delivery Method: Circle system utilized Preoxygenation: Pre-oxygenation with 100% oxygen Induction Type: IV induction Ventilation: Mask ventilation without difficulty Laryngoscope Size: Mac and 3 Grade View: Grade I Tube type: Oral Rae Tube size: 6.5 mm Number of attempts: 1 Airway Equipment and Method: Stylet Placement Confirmation: ETT inserted through vocal cords under direct vision, positive ETCO2 and breath sounds checked- equal and bilateral Tube secured with: Tape Dental Injury: Teeth and Oropharynx as per pre-operative assessment

## 2023-09-17 LAB — SURGICAL PATHOLOGY

## 2023-10-04 ENCOUNTER — Ambulatory Visit (INDEPENDENT_AMBULATORY_CARE_PROVIDER_SITE_OTHER): Admitting: Family Medicine

## 2023-10-04 ENCOUNTER — Encounter: Payer: Self-pay | Admitting: Family Medicine

## 2023-10-04 VITALS — BP 100/67 | HR 92 | Ht 68.0 in | Wt 154.0 lb

## 2023-10-04 DIAGNOSIS — Z0000 Encounter for general adult medical examination without abnormal findings: Secondary | ICD-10-CM | POA: Diagnosis not present

## 2023-10-04 DIAGNOSIS — Z130 Encounter for screening for diseases of the blood and blood-forming organs and certain disorders involving the immune mechanism: Secondary | ICD-10-CM | POA: Diagnosis not present

## 2023-10-04 DIAGNOSIS — L309 Dermatitis, unspecified: Secondary | ICD-10-CM | POA: Diagnosis not present

## 2023-10-04 DIAGNOSIS — Z136 Encounter for screening for cardiovascular disorders: Secondary | ICD-10-CM

## 2023-10-04 DIAGNOSIS — Z13228 Encounter for screening for other metabolic disorders: Secondary | ICD-10-CM

## 2023-10-04 DIAGNOSIS — Z1329 Encounter for screening for other suspected endocrine disorder: Secondary | ICD-10-CM

## 2023-10-04 DIAGNOSIS — J4520 Mild intermittent asthma, uncomplicated: Secondary | ICD-10-CM | POA: Diagnosis not present

## 2023-10-04 NOTE — Progress Notes (Signed)
 Complete physical exam   Patient: Janet Ross   DOB: 2001-01-14   23 y.o. Female  MRN: [id number] Visit Date: 10/04/2023  Today's healthcare provider: LAURAINE LOISE BUOY, DO   Chief Complaint  Patient presents with   Annual Exam    Last completed 10/03/22 Diet -  General Exercise - on occasions, if able she runs based off mileage and not time preferring to run 2-3 miles Feeling - well Sleeping - well Concerns - none    Care Management    Pneumococcal Vaccine Declined Tetanus Vaccine declined Chlamydia Screening declined Cervical Cancer Screening completed at student health clinic at school // ok to request records   Subjective    Janet Ross is a 23 y.o. female who presents today for a complete physical exam.   HPI HPI     Annual Exam    Additional comments: Last completed 10/03/22 Diet -  General Exercise - on occasions, if able she runs based off mileage and not time preferring to run 2-3 miles Feeling - well Sleeping - well Concerns - none         Care Management    Additional comments: Pneumococcal Vaccine Declined Tetanus Vaccine declined Chlamydia Screening declined Cervical Cancer Screening completed at student health clinic at school // ok to request records      Last edited by Lilian Fitzpatrick, CMA on 10/04/2023 11:20 AM.      Janet Ross is a 23 year old female who presents for an annual physical exam.  She has a history of asthma but reports no recent exacerbations. She also has a history of eczema but reports no current flare-ups.  She is currently on birth control and experienced abnormal bleeding earlier this year, which led to a cervical cancer screening. She is not sexually active and declined chlamydia screening for this reason.  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 She consumes alcohol occasionally, typically a couple of times a month, and usually has three to four drinks per  occasion. She denies any negative consequences from drinking.  She does not plan to receive the COVID booster or flu shot this year. She does not have regular vision checks but does see a dentist.     Past Medical History:  Diagnosis Date   Allergic rhinitis    Chronic kidney disease, stage 1    Eczema    History of MRSA infection 05/07/2012   Medical history non-contributory    Mild intermittent asthma in adult without complication    Past Surgical History:  Procedure Laterality Date   NO PAST SURGERIES     TONSILLECTOMY AND ADENOIDECTOMY Bilateral 09/13/2023   Procedure: TONSILLECTOMY AND ADENOIDECTOMY;  Surgeon: Herminio Miu, MD;  Location: [facility];  Service: ENT;  Laterality: Bilateral;   Social History   Socioeconomic History   Marital status: Single    Spouse name: single   Number of children: 0   Years of education: 6   Highest education level: Not on file  Occupational History   Occupation: [employer]    Comment: going to 7th grade  Tobacco Use   Smoking status: Never    Passive exposure: Never   Smokeless tobacco: Never  Vaping Use   Vaping status: Never Used  Substance and Sexual Activity   Alcohol use: Yes    Comment: occasionally   Drug use: No   Sexual activity: Yes  Birth control/protection: Condom, Pill, None  Other Topics Concern   Not on file  Social History Narrative   Not on file   Social Drivers of Health   Financial Resource Strain: Not on file  Food Insecurity: Not on file  Transportation Needs: Not on file  Physical Activity: Not on file  Stress: Not on file  Social Connections: Not on file  Intimate Partner Violence: Not on file   Family Status  Relation Name Status   Mother  Alive   Father Nhu Glasby Alive   Brother [employer] Alive   Brother  Alive   MGF Gretel Bish (Not Specified)   Neg Hx  (Not Specified)  No partnership data on file   Family History  Problem Relation Age of Onset    Healthy Mother    Hypertension Father    Diabetes Father    Hyperlipidemia Father    ADD / ADHD Brother    Hypertension Maternal Grandfather    Colon cancer Neg Hx    Breast cancer Neg Hx    Ovarian cancer Neg Hx    Allergies  Allergen Reactions   Sulfa Antibiotics Other (See Comments)    Other reaction(s): Headache    Patient Care Team: Cherlyn Syring N, DO as PCP - General (Family Medicine) [facility]   Medications: Outpatient Medications Prior to Visit  Medication Sig   albuterol (VENTOLIN HFA) 108 (90 Base) MCG/ACT inhaler Inhale 2 puffs into the lungs every 6 (six) hours as needed for wheezing or shortness of breath.   desogestrel-ethinyl estradiol (ISIBLOOM) 0.15-30 MG-MCG tablet Take 1 tablet by mouth daily.   HYDROcodone-acetaminophen (HYCET) 7.5-325 mg/15 ml solution Take 10 mLs by mouth 4 (four) times daily as needed for moderate pain (pain score 4-6).   ipratropium (ATROVENT) 0.03 % nasal spray Place 2 sprays into both nostrils every 12 (twelve) hours.   levocetirizine (XYZAL) 5 MG tablet Take 1 tablet (5 mg total) by mouth every evening.   lidocaine (XYLOCAINE) 2 % solution Use as directed 10 mLs in the mouth or throat as needed (swish, gargle and spit prn).   No facility-administered medications prior to visit.    Review of Systems  Constitutional:  Negative for chills, fatigue and fever.  HENT:  Negative for congestion, ear pain, rhinorrhea, sneezing and sore throat.   Eyes: Negative.  Negative for pain and redness.  Respiratory:  Negative for cough, shortness of breath and wheezing.   Cardiovascular:  Negative for chest pain and leg swelling.  Gastrointestinal:  Negative for abdominal pain, blood in stool, constipation, diarrhea and nausea.  Endocrine: Negative for polydipsia and polyphagia.  Genitourinary: Negative.  Negative for dysuria, flank pain, hematuria, pelvic pain, vaginal bleeding and vaginal discharge.  Musculoskeletal:  Negative for  arthralgias, back pain, gait problem and joint swelling.  Skin:  Negative for rash.  Neurological: Negative.  Negative for dizziness, tremors, seizures, weakness, light-headedness, numbness and headaches.  Hematological:  Negative for adenopathy.  Psychiatric/Behavioral: Negative.  Negative for behavioral problems, confusion and dysphoric mood. The patient is not nervous/anxious and is not hyperactive.       Objective    BP 100/67 (BP Location: Left Arm, Patient Position: Sitting, Cuff Size: Normal)   Pulse 92   Ht 5' 8 (1.727 m)   Wt 154 lb (69.9 kg)   SpO2 99%   BMI 23.42 kg/m    Physical Exam Vitals and nursing note reviewed.  Constitutional:      General: She is  awake.     Appearance: Normal appearance.  HENT:     Head: Normocephalic and atraumatic.     Right Ear: Tympanic membrane, ear canal and external ear normal.     Left Ear: Tympanic membrane, ear canal and external ear normal.     Nose: Nose normal.     Mouth/Throat:     Mouth: Mucous membranes are moist.     Pharynx: Oropharynx is clear. No oropharyngeal exudate or posterior oropharyngeal erythema.  Eyes:     General: No scleral icterus.    Extraocular Movements: Extraocular movements intact.     Conjunctiva/sclera: Conjunctivae normal.     Pupils: Pupils are equal, round, and reactive to light.  Neck:     Thyroid : No thyromegaly or thyroid  tenderness.  Cardiovascular:     Rate and Rhythm: Normal rate and regular rhythm.     Pulses: Normal pulses.     Heart sounds: Normal heart sounds.  Pulmonary:     Effort: Pulmonary effort is normal. No tachypnea, bradypnea or respiratory distress.     Breath sounds: Normal breath sounds. No stridor. No wheezing, rhonchi or rales.  Abdominal:     General: Bowel sounds are normal. There is no distension.     Palpations: Abdomen is soft. There is no mass.     Tenderness: There is no abdominal tenderness. There is no guarding.     Hernia: No hernia is present.   Musculoskeletal:     Cervical back: Normal range of motion and neck supple.     Right lower leg: No edema.     Left lower leg: No edema.  Lymphadenopathy:     Cervical: No cervical adenopathy.  Skin:    General: Skin is warm and dry.     Findings: Lesion (scar to right knee, s/t trip and fall) present.  Neurological:     Mental Status: She is alert and oriented to person, place, and time. Mental status is at baseline.  Psychiatric:        Mood and Affect: Mood normal.        Behavior: Behavior normal.      Last depression screening scores    10/04/2023   11:26 AM 10/03/2022    2:51 PM 09/28/2021    2:21 PM  PHQ 2/9 Scores  PHQ - 2 Score 0 0 0  PHQ- 9 Score  0 0   Last fall risk screening    10/03/2022    2:51 PM  Fall Risk   Falls in the past year? 0  Number falls in past yr: 0  Injury with Fall? 1   Last Audit-C alcohol use screening    10/04/2023   11:45 AM  Alcohol Use Disorder Test (AUDIT)  1. How often do you have a drink containing alcohol? 2  2. How many drinks containing alcohol do you have on a typical day when you are drinking? 1  3. How often do you have six or more drinks on one occasion? 0  AUDIT-C Score 3  4. How often during the last year have you found that you were not able to stop drinking once you had started? 0  5. How often during the last year have you failed to do what was normally expected from you because of drinking? 0  6. How often during the last year have you needed a first drink in the morning to get yourself going after a heavy drinking session? 0  7. How often during the last  year have you had a feeling of guilt of remorse after drinking? 0  8. How often during the last year have you been unable to remember what happened the night before because you had been drinking? 0  9. Have you or someone else been injured as a result of your drinking? 0  10. Has a relative or friend or a doctor or another health worker been concerned about your  drinking or suggested you cut down? 0  Alcohol Use Disorder Identification Test Final Score (AUDIT) 3  Alcohol Brief Interventions/Follow-up Alcohol education/Brief advice   A score of 3 or more in women, and 4 or more in men indicates increased risk for alcohol abuse, EXCEPT if all of the points are from question 1   No results found for any visits on 10/04/23.  Assessment & Plan    Routine Health Maintenance and Physical Exam  Exercise Activities and Dietary recommendations  Goals   None     Immunization History  Administered Date(s) Administered   DTaP 09/08/2002, 11/20/2002, 01/08/2003, 10/15/2003, 04/18/2007   HIB (PRP-OMP) 09/08/2002, 11/30/2002, 01/08/2003, 07/09/2003   HPV 9-valent 02/16/2015, 04/25/2015, 08/24/2016   Hepatitis A, Ped/Adol-2 Dose 08/16/2014, 02/16/2015   Hepatitis B Sep 24, 2002, 11/20/2002, 10/15/2003   IPV 09/08/2002, 01/08/2003, 10/15/2003, 04/18/2007   Influenza,inj,Quad PF,6+ Mos 12/16/2014, 12/04/2016, 11/05/2018   Influenza-Unspecified 11/26/2013   MMR 07/09/2003, 04/18/2007   Meningococcal B, OMV 09/03/2018, 11/14/2018   Meningococcal Conjugate 08/05/2013   Meningococcal Mcv4o 09/03/2018   PFIZER(Purple Top)SARS-COV-2 Vaccination 09/19/2019   PPD Test 06/24/2017   Pneumococcal-Unspecified 09/08/2002, 11/20/2002, 01/08/2003, 07/09/2003   Td 08/05/2013   Tdap 08/05/2013   Varicella 07/09/2003, 04/18/2007    Health Maintenance  Topic Date Due   Cervical Cancer Screening (Pap smear)  10/18/2023 (Originally 07/02/2023)   INFLUENZA VACCINE  05/19/2024 (Originally 09/20/2023)   DTaP/Tdap/Td (8 - Td or Tdap) 10/03/2024 (Originally 08/06/2023)   Pneumococcal Vaccine (1 of 2 - PCV) 10/03/2024 (Originally 07/01/2021)   CHLAMYDIA SCREENING  10/03/2024 (Originally 07/01/2017)   COVID-19 Vaccine (2 - 2024-25 season) 10/03/2024 (Originally 10/21/2022)   Hepatitis B Vaccines 19-59 Average Risk  Completed   HPV VACCINES  Completed   Hepatitis C Screening   Completed   HIV Screening  Completed   Meningococcal B Vaccine  Completed    Discussed health benefits of physical activity, and encouraged her to engage in regular exercise appropriate for her age and condition.   Annual physical exam  Screening for endocrine, metabolic and immunity disorder -     Comprehensive metabolic panel with GFR  Encounter for screening for cardiovascular disorders -     Lipid Panel With LDL/HDL Ratio  Mild intermittent asthma without complication  Eczema, unspecified type   Adult Wellness Visit Routine wellness visit. Physical exam overall unremarkable except as noted above. Routine lab work ordered as noted. Declined COVID booster and flu shot. Due for Tdap and eligible for pneumonia vaccine. Discussed Pap smear despite not being sexually active. - Recommend Tdap, administer here or pharmacy.  Patient declined for today. - Discuss Pap smear, schedule here or refer to OB/GYN.  Patient deferred. - Encourage vision checks every two years. - Advise sunscreen during prolonged sun exposure. - Limit alcohol to two drinks per occasion. - Discuss condom use if sexually active. - Draw blood for metabolic and cholesterol panels, in clinic or [facility].  Asthma No recent issues. Eligible for pneumonia vaccine. - Offer pneumonia vaccine, patient declined for today.  Advised her she may schedule it in clinic or  at the pharmacy if she decides to get it.  Eczema No recent problems.    Return in about 1 year (around 10/03/2024) for CPE.     I discussed the assessment and treatment plan with the patient  The patient was provided an opportunity to ask questions and all were answered. The patient agreed with the plan and demonstrated an understanding of the instructions.   The patient was advised to call back or seek an in-person evaluation if the symptoms worsen or if the condition fails to improve as anticipated.    LAURAINE LOISE BUOY, DO  [facility] [phone] (phone) [phone] (fax)  [facility]

## 2023-10-05 LAB — COMPREHENSIVE METABOLIC PANEL WITH GFR
ALT: 11 IU/L (ref 0–32)
AST: 19 IU/L (ref 0–40)
Albumin: 4.5 g/dL (ref 4.0–5.0)
Alkaline Phosphatase: 74 IU/L (ref 44–121)
BUN/Creatinine Ratio: 10 (ref 9–23)
BUN: 10 mg/dL (ref 6–20)
Bilirubin Total: 0.4 mg/dL (ref 0.0–1.2)
CO2: 23 mmol/L (ref 20–29)
Calcium: 9.4 mg/dL (ref 8.7–10.2)
Chloride: 103 mmol/L (ref 96–106)
Creatinine, Ser: 0.96 mg/dL (ref 0.57–1.00)
Globulin, Total: 2.3 g/dL (ref 1.5–4.5)
Glucose: 88 mg/dL (ref 70–99)
Potassium: 4.1 mmol/L (ref 3.5–5.2)
Sodium: 141 mmol/L (ref 134–144)
Total Protein: 6.8 g/dL (ref 6.0–8.5)
eGFR: 86 mL/min/1.73 (ref >59)

## 2023-10-05 LAB — LIPID PANEL WITH LDL/HDL RATIO
Cholesterol, Total: 133 mg/dL (ref 100–199)
HDL: 44 mg/dL (ref >39)
LDL Chol Calc (NIH): 67 mg/dL (ref 0–99)
LDL/HDL Ratio: 1.5 ratio (ref 0.0–3.2)
Triglycerides: 123 mg/dL (ref 0–149)
VLDL Cholesterol Cal: 22 mg/dL (ref 5–40)

## 2023-10-07 ENCOUNTER — Ambulatory Visit: Payer: Self-pay | Admitting: Family Medicine
